# Patient Record
Sex: Male | Born: 2005 | Race: Black or African American | Hispanic: No | Marital: Single | Smoking: Never smoker
Health system: Southern US, Community
[De-identification: ages and names within clinical notes are randomized; demographics above are authoritative.]

## PROBLEM LIST (undated history)

## (undated) DIAGNOSIS — J45909 Unspecified asthma, uncomplicated: Secondary | ICD-10-CM

---
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
## 2024-06-21 ENCOUNTER — Other Ambulatory Visit (HOSPITAL_BASED_OUTPATIENT_CLINIC_OR_DEPARTMENT_OTHER): Payer: Self-pay

## 2024-06-21 MED ORDER — BEXSERO 0.5 ML IM SUSY
0.5000 mL | PREFILLED_SYRINGE | Freq: Once | INTRAMUSCULAR | 0 refills | Status: AC
  Filled 2024-06-21: qty 0.5, 1d supply, fill #0
